# Patient Record
Sex: Male | Born: 1990 | Race: White | Hispanic: No | Marital: Single | Smoking: Never smoker
Health system: Southern US, Community
[De-identification: ages and names within clinical notes are randomized; demographics above are authoritative.]

---

## 2017-03-17 ENCOUNTER — Encounter: Payer: Self-pay | Admitting: Emergency Medicine

## 2017-03-17 ENCOUNTER — Emergency Department (INDEPENDENT_AMBULATORY_CARE_PROVIDER_SITE_OTHER): Payer: BLUE CROSS/BLUE SHIELD

## 2017-03-17 ENCOUNTER — Emergency Department
Admission: EM | Admit: 2017-03-17 | Discharge: 2017-03-17 | Disposition: A | Discharge status: Home / Self Care | Payer: BLUE CROSS/BLUE SHIELD | Source: Home / Self Care | Attending: Family Medicine | Admitting: Family Medicine

## 2017-03-17 DIAGNOSIS — W268XXA Contact with other sharp object(s), not elsewhere classified, initial encounter: Secondary | ICD-10-CM | POA: Diagnosis not present

## 2017-03-17 DIAGNOSIS — S81831A Puncture wound without foreign body, right lower leg, initial encounter: Secondary | ICD-10-CM

## 2017-03-17 DIAGNOSIS — S91311A Laceration without foreign body, right foot, initial encounter: Secondary | ICD-10-CM

## 2017-03-17 DIAGNOSIS — S99921A Unspecified injury of right foot, initial encounter: Secondary | ICD-10-CM

## 2017-03-17 DIAGNOSIS — S80811A Abrasion, right lower leg, initial encounter: Secondary | ICD-10-CM

## 2017-03-17 MED ORDER — DOXYCYCLINE HYCLATE 100 MG PO CAPS: 100.0000 mg | ORAL_CAPSULE | Freq: Two times a day (BID) | ORAL | 0 refills | Status: AC

## 2017-03-17 MED ORDER — TRAMADOL HCL 50 MG PO TABS: 50.0000 mg | ORAL_TABLET | Freq: Four times a day (QID) | ORAL | 0 refills | Status: AC | PRN

## 2017-03-17 NOTE — Discharge Instructions (Signed)
°  You may take 500mg acetaminophen every 4-6 hours or in combination with ibuprofen 400-600mg every 6-8 hours as needed for pain and inflammation. ° °Tramadol is strong pain medication. While taking, do not drink alcohol, drive, or perform any other activities that requires focus while taking these medications.  ° ° °

## 2017-03-17 NOTE — ED Triage Notes (Signed)
Patient presents to [facility] with a puncture wound noted to Right shin area. Patient jumped down off a boat dock and struck his shin on a metal boat dock. Puncture area is approximately the size of a dime. Discoloration and an abrasion noted to the lower leg. Last Tetanus 2 months ago.

## 2017-03-17 NOTE — ED Provider Notes (Signed)
[facility]    CSN: [id number] Arrival date & time: 03/17/17  1543     History   Chief Complaint Chief Complaint  Patient presents with  . Puncture Wound    HPI Bill Burke is a 26 y.o. male.   HPI Bill Burke is a 26 y.o. male presenting to UC with c/o puncture wound and abrasion to Right lower leg that occurred earlier this afternoon. Pt was jumping from his boat onto a dock and struck his leg against a metal part on the dock.  Last tetanus was 2 months ago.  Pain is 3/10, burning, worse with ambulation.  Bleeding somewhat controlled with pressure bandage but oozes when walking.  No other injuries.    History reviewed. No pertinent past medical history.  There are no active problems to display for this patient.   History reviewed. No pertinent surgical history.     Home Medications    Prior to Admission medications   Medication Sig Start Date End Date Taking? Authorizing Provider  doxycycline (VIBRAMYCIN) 100 MG capsule Take 1 capsule (100 mg total) by mouth 2 (two) times daily. One po bid x 10 days 03/17/17   Lurene Shadow, PA-C  traMADol (ULTRAM) 50 MG tablet Take 1 tablet (50 mg total) by mouth every 6 (six) hours as needed. 03/17/17   Lurene Shadow, PA-C    Family History History reviewed. No pertinent family history.  Social History Social History  Substance Use Topics  . Smoking status: Never Smoker  . Smokeless tobacco: Never Used  . Alcohol use 6.0 oz/week    10 Cans of beer per week     Comment: Per day     Allergies   Patient has no allergy information on record.   Review of Systems Review of Systems  Musculoskeletal: Positive for myalgias. Negative for arthralgias and gait problem.  Skin: Positive for wound. Negative for color change.     Physical Exam Triage Vital Signs ED Triage Vitals  Enc Vitals Group     BP 03/17/17 1629 131/81     Pulse Rate 03/17/17 1629 89     Resp 03/17/17 1629 16     Temp 03/17/17 1629 98.3  F (36.8 C)     Temp Source 03/17/17 1629 Oral     SpO2 03/17/17 1629 98 %     Weight 03/17/17 1630 250 lb (113.4 kg)     Height 03/17/17 1630  (1.854 m)     Head Circumference --      Peak Flow --      Pain Score 03/17/17 1630 3     Pain Loc --      Pain Edu? --      Excl. in GC? --    No data found.   Updated Vital Signs BP 131/81 (BP Location: Right Arm)   Pulse 89   Temp 98.3 F (36.8 C) (Oral)   Resp 16   Ht  (1.854 m)   Wt 250 lb (113.4 kg)   SpO2 98%   BMI 32.98 kg/m   Visual Acuity Right Eye Distance:   Left Eye Distance:   Bilateral Distance:    Right Eye Near:   Left Eye Near:    Bilateral Near:     Physical Exam  Constitutional: He is oriented to person, place, and time. He appears well-developed and well-nourished.  HENT:  Head: Normocephalic and atraumatic.  Eyes: EOM are normal.  Neck: Normal range of motion.  Cardiovascular: Normal rate.   Pulses:      Dorsalis pedis pulses are 2+ on the right side.       Posterior tibial pulses are 2+ on the right side.  Pulmonary/Chest: Effort normal.  Musculoskeletal: Normal range of motion. He exhibits tenderness. He exhibits no edema.  Right leg: full ROM knee and ankle, calf is soft, non-tender (see skin exam) Right foot: mild edema and tenderness to dorsal of foot.   Neurological: He is alert and oriented to person, place, and time.  Skin: Skin is warm and dry.     Right anterior lower leg: dime-sized puncture wound oozing red blood. Muscle tissue exposed. No bone visualized. No foreign bodies seen or palpated.   Superficial abrasion from puncture wound to anterior ankle.  Psychiatric: He has a normal mood and affect. His behavior is normal.  Nursing note and vitals reviewed.    UC Treatments / Results  Labs (all labs ordered are listed, but only abnormal results are displayed) Labs Reviewed - No data to display  EKG  EKG Interpretation None       Radiology Dg Tibia/fibula  Right  Result Date: 03/17/2017 CLINICAL DATA:  Right lower leg puncture wound. EXAM: RIGHT TIBIA AND FIBULA - 2 VIEW COMPARISON:  None. FINDINGS: There is no evidence of fracture or other focal bone lesions. No radiopaque foreign body. Soft tissues are unremarkable. IMPRESSION: Negative.  No radiopaque foreign body. Electronically Signed   By: Obie Dredge M.D.   On: 03/17/2017 17:11   Dg Foot Complete Right  Result Date: 03/17/2017 CLINICAL DATA:  Laceration. EXAM: RIGHT FOOT COMPLETE - 3+ VIEW COMPARISON:  None. FINDINGS: There is no evidence of fracture or dislocation. There is no evidence of arthropathy or other focal bone abnormality. Soft tissues are unremarkable. IMPRESSION: Negative. Electronically Signed   By: Obie Dredge M.D.   On: 03/17/2017 17:12    Procedures .Marland KitchenLaceration Repair Date/Time: 03/18/2017 9:08 AM Performed by: Lurene Shadow Authorized by: Donna Christen A   Consent:    Consent obtained:  Verbal   Consent given by:  Patient   Risks discussed:  Infection, pain, poor cosmetic result, poor wound healing, nerve damage and need for additional repair   Alternatives discussed:  No treatment and delayed treatment Anesthesia (see MAR for exact dosages):    Anesthesia method:  Local infiltration   Local anesthetic:  Lidocaine 2% WITH epi Laceration details:    Location:  Leg   Leg location:  R lower leg   Length (cm):  1.8   Depth (mm):  5 Pre-procedure details:    Preparation:  Patient was prepped and draped in usual sterile fashion and imaging obtained to evaluate for foreign bodies Exploration:    Hemostasis achieved with:  Direct pressure   Wound exploration: wound explored through full range of motion and entire depth of wound probed and visualized     Wound extent: areolar tissue violated, fascia violated and nerve damage ( around edges of puncture wound)     Wound extent: no foreign bodies/material noted, no muscle damage noted, no tendon damage noted, no  underlying fracture noted and no vascular damage noted     Contaminated: no   Treatment:    Area cleansed with:  Betadine and saline   Amount of cleaning:  Extensive   Irrigation solution:  Sterile saline   Irrigation volume:  80cc   Irrigation method:  Syringe Mucous membrane repair:    Suture size:  4-0  Suture material:  Vicryl   Suture technique:  Vertical mattress   Number of sutures:  6 Skin repair:    Repair method: wound edges too far apart to approximate wound edges. Approximation:    Approximation:  Loose Post-procedure details:    Dressing:  Antibiotic ointment and bulky dressing   Patient tolerance of procedure:  Tolerated well, no immediate complications   (including critical care time)  Medications Ordered in UC Medications - No data to display   Initial Impression / Assessment and Plan / UC Course  I have reviewed the triage vital signs and the nursing notes.  Pertinent labs & imaging results that were available during my care of the patient were reviewed by me and considered in my medical decision making (see chart for details).     Deep puncture wound to anterior Right lower leg.  Six 4-0 vicryl applied under skin layer to help re-approximate some of wound. Unable to approximate skin edges due to width of wound.   Wound extensively flushed due to wound coming in contact with [place] water.  Bacitracin and pressure bandage applied. Wound will need to heal via secondary intension. Pt plans to drive down to [place] for a day trip. Encouraged to f/u in 2-3 days for wound recheck.  Encouraged to change bandage 2-3 times daily.  Will start on prophylactic Doxycycline.    Final Clinical Impressions(s) / UC Diagnoses   Final diagnoses:  Right foot injury, initial encounter  Puncture wound of right lower leg, initial encounter  Abrasion, right lower leg, initial encounter    New Prescriptions Discharge Medication List as of 03/17/2017  5:50 PM    START taking  these medications   Details  doxycycline (VIBRAMYCIN) 100 MG capsule Take 1 capsule (100 mg total) by mouth 2 (two) times daily. One po bid x 10 days, Starting Sun 03/17/2017, Print    traMADol (ULTRAM) 50 MG tablet Take 1 tablet (50 mg total) by mouth every 6 (six) hours as needed., Starting Sun 03/17/2017, Print         Controlled Substance Prescriptions [place] Controlled Substance Registry consulted? Yes, I have consulted the [place] Controlled Substances Registry for this patient, and feel the risk/benefit ratio today is favorable for proceeding with this prescription for a controlled substance.   Lurene Shadow, [place] 03/18/17 [phone]

## 2017-03-21 ENCOUNTER — Telehealth: Payer: Self-pay

## 2017-03-21 NOTE — Telephone Encounter (Signed)
Left VM to follow up with UC as needed, and to call if any questions or concerns.  Contact information left.

## 2018-02-26 IMAGING — DX DG FOOT COMPLETE 3+V*R*
3 series · 3 of 3 positions shown · non-contrast
Comparison: None.

CLINICAL DATA: Laceration.

EXAM:
RIGHT FOOT COMPLETE - 3+ VIEW

[foot ap]
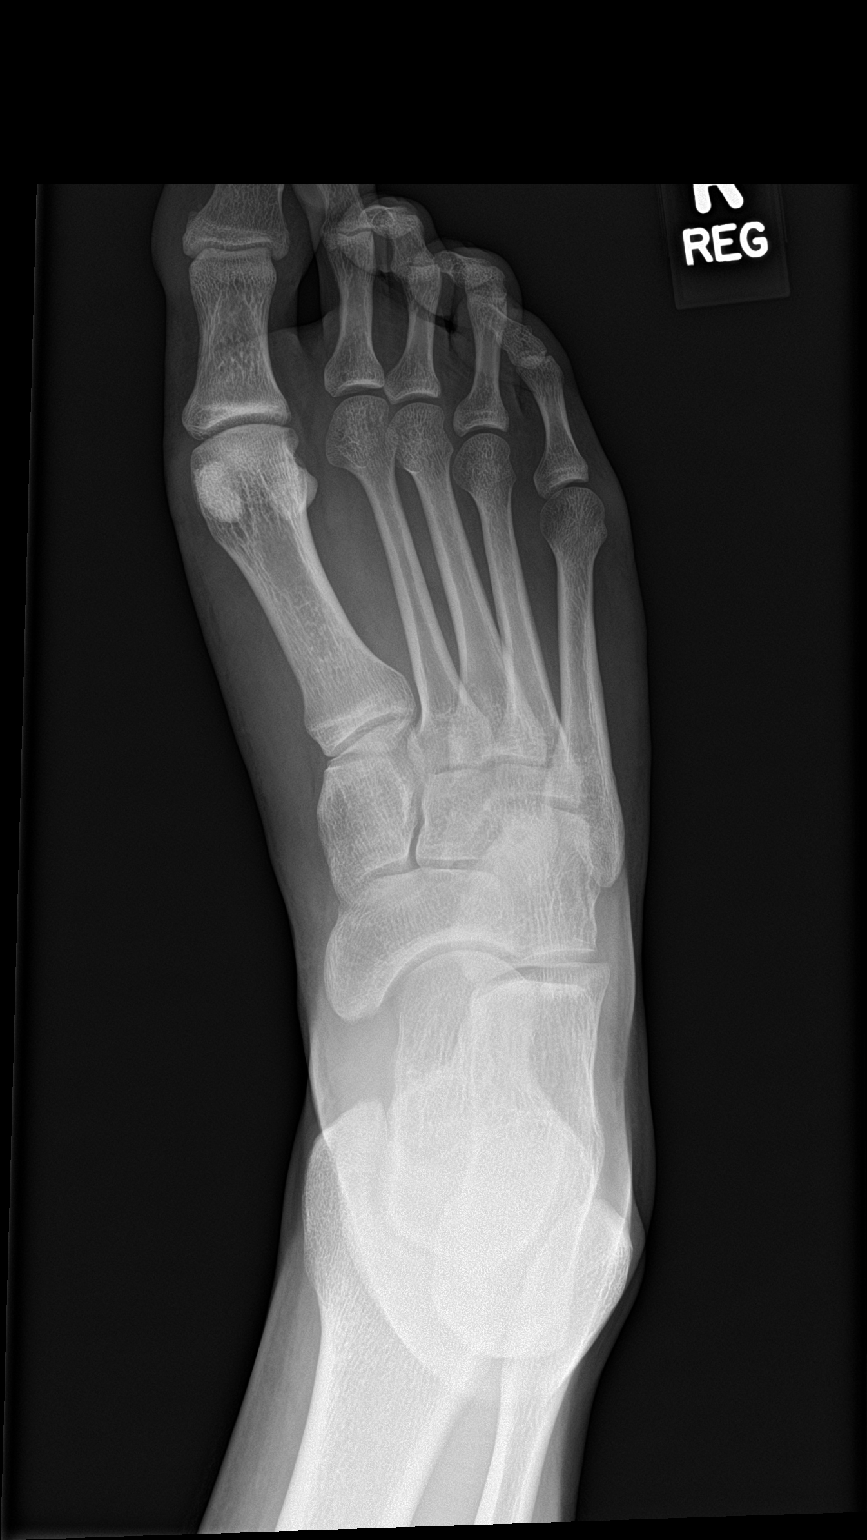

[foot obl]
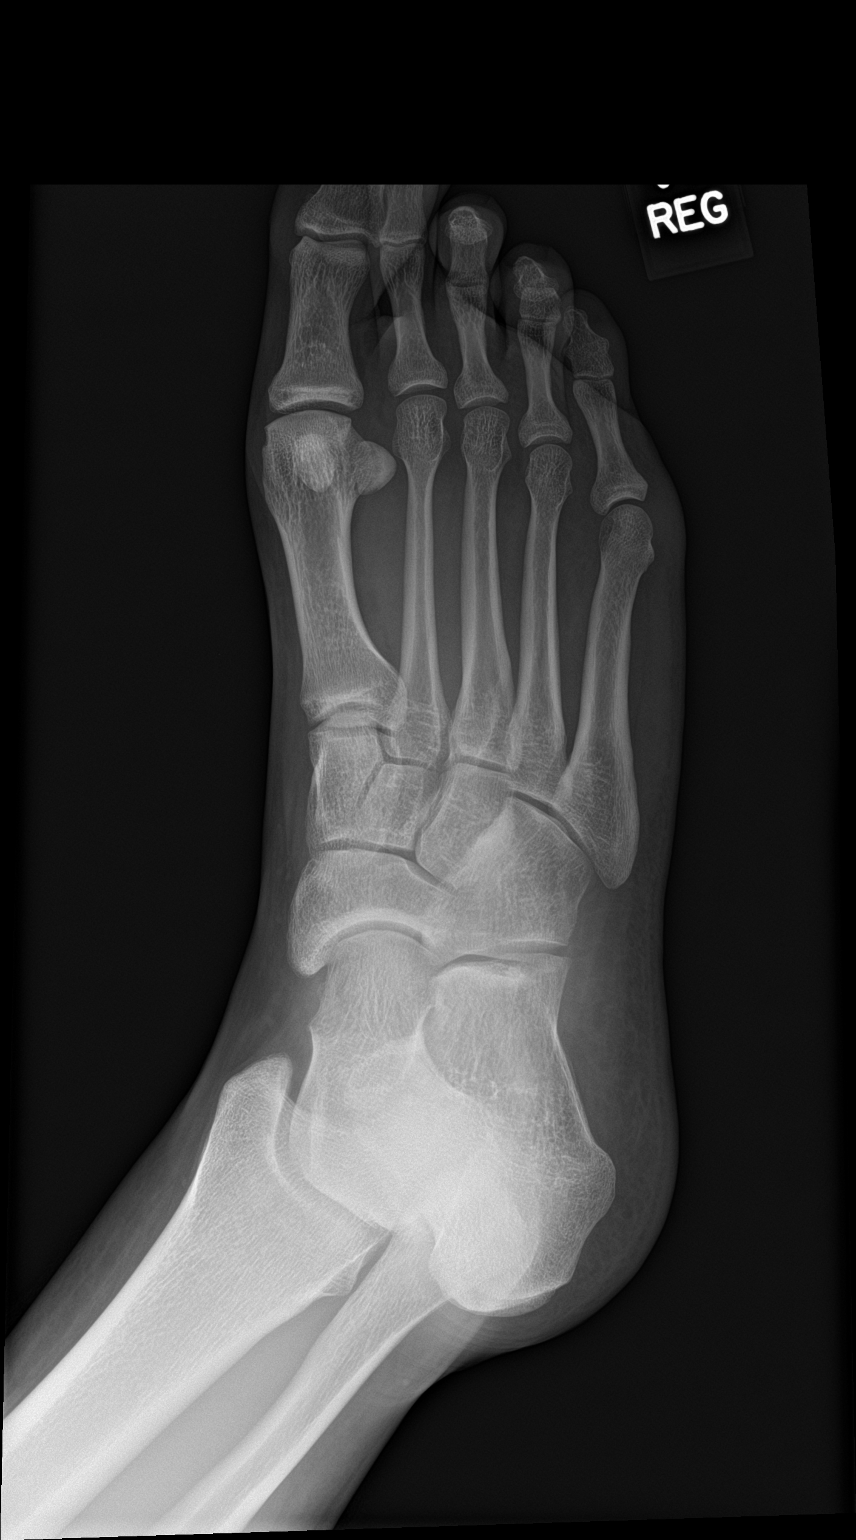

[foot lat]
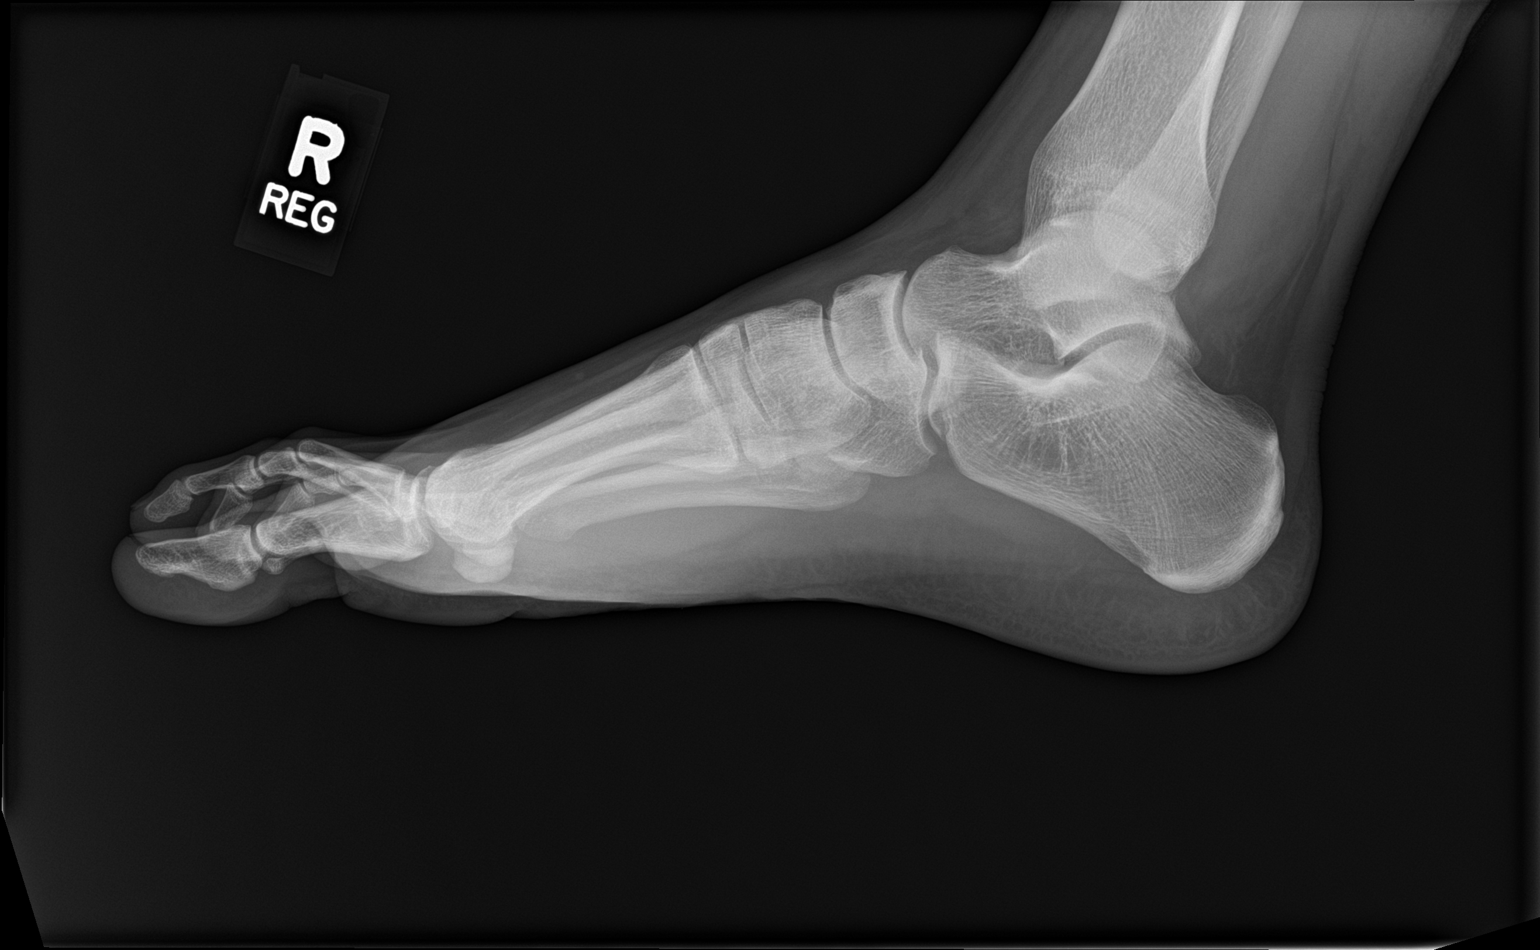

[3 of 3 positions shown; findings below may reference images not displayed]

FINDINGS: There is no evidence of fracture or dislocation. There is no
evidence of arthropathy or other focal bone abnormality. Soft
tissues are unremarkable.
IMPRESSION: Negative.

## 2018-02-26 IMAGING — DX DG TIBIA/FIBULA 2V*R*
4 series · 4 of 4 positions shown · non-contrast
Comparison: None.

CLINICAL DATA: Right lower leg puncture wound.

EXAM:
RIGHT TIBIA AND FIBULA - 2 VIEW

[tibia ap (1 of 2)]
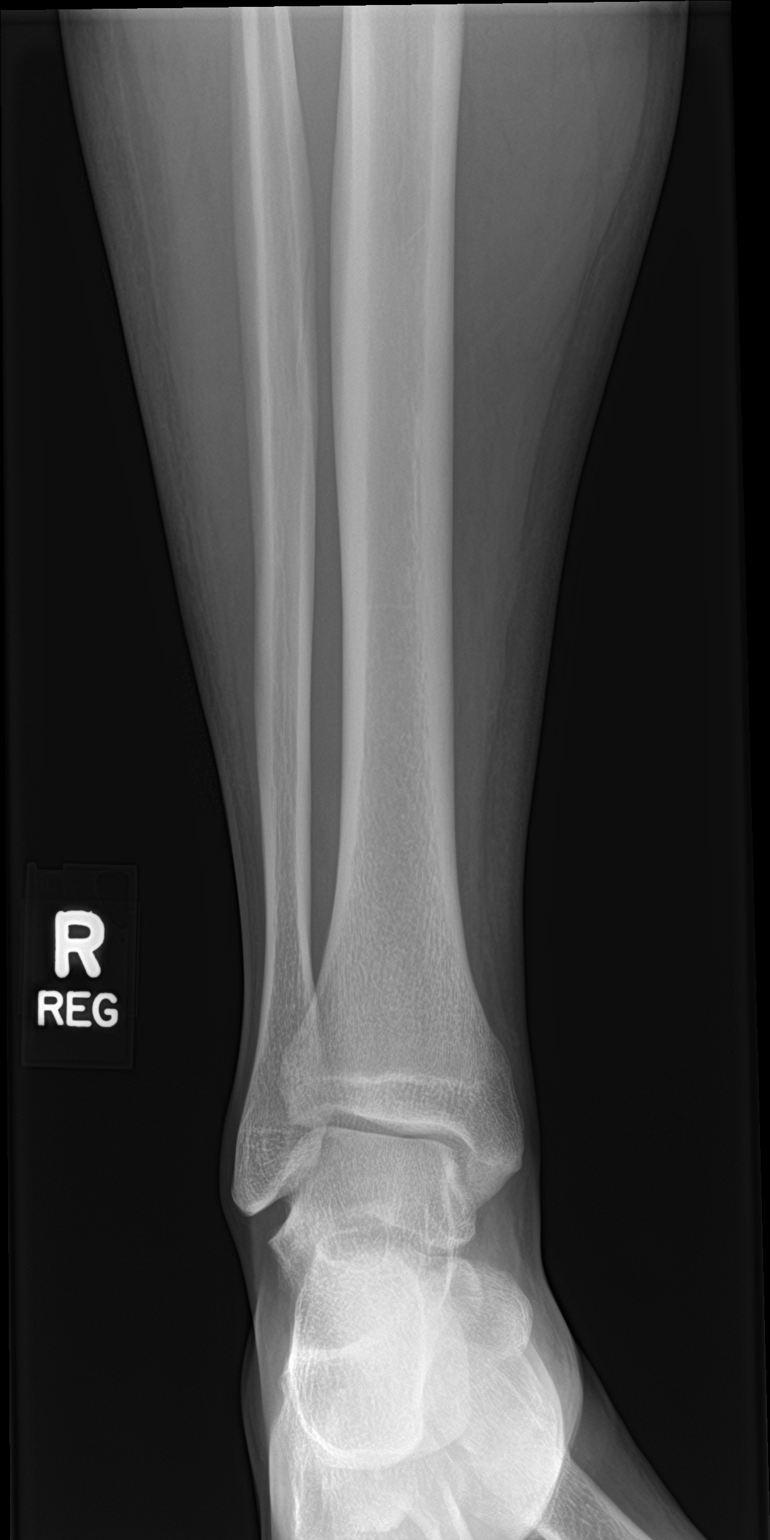

[tibia ap (2 of 2)]
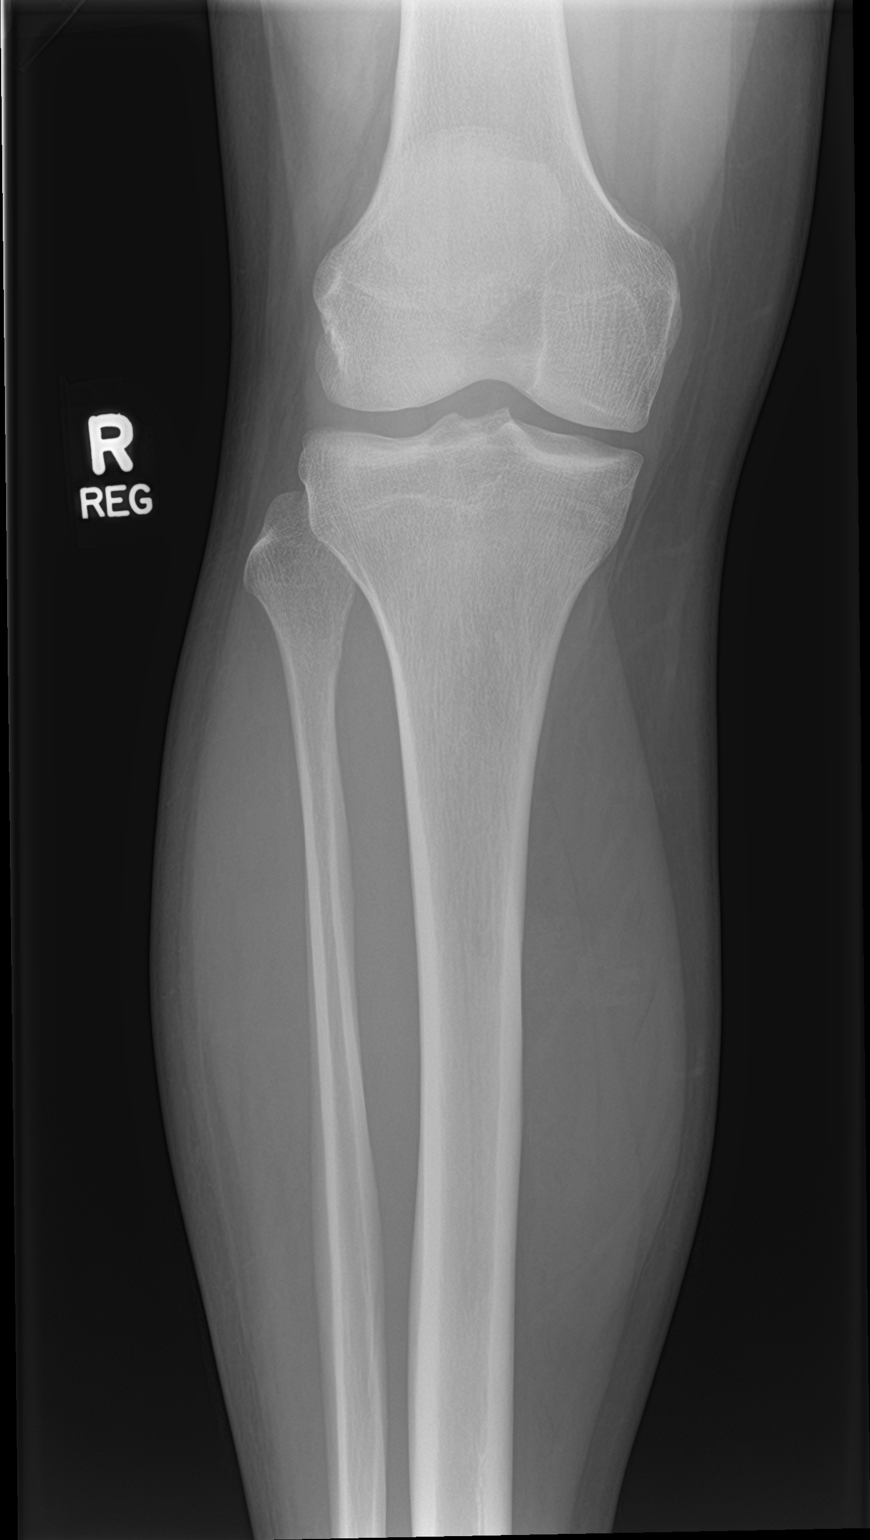

[tibia lat (1 of 2)]
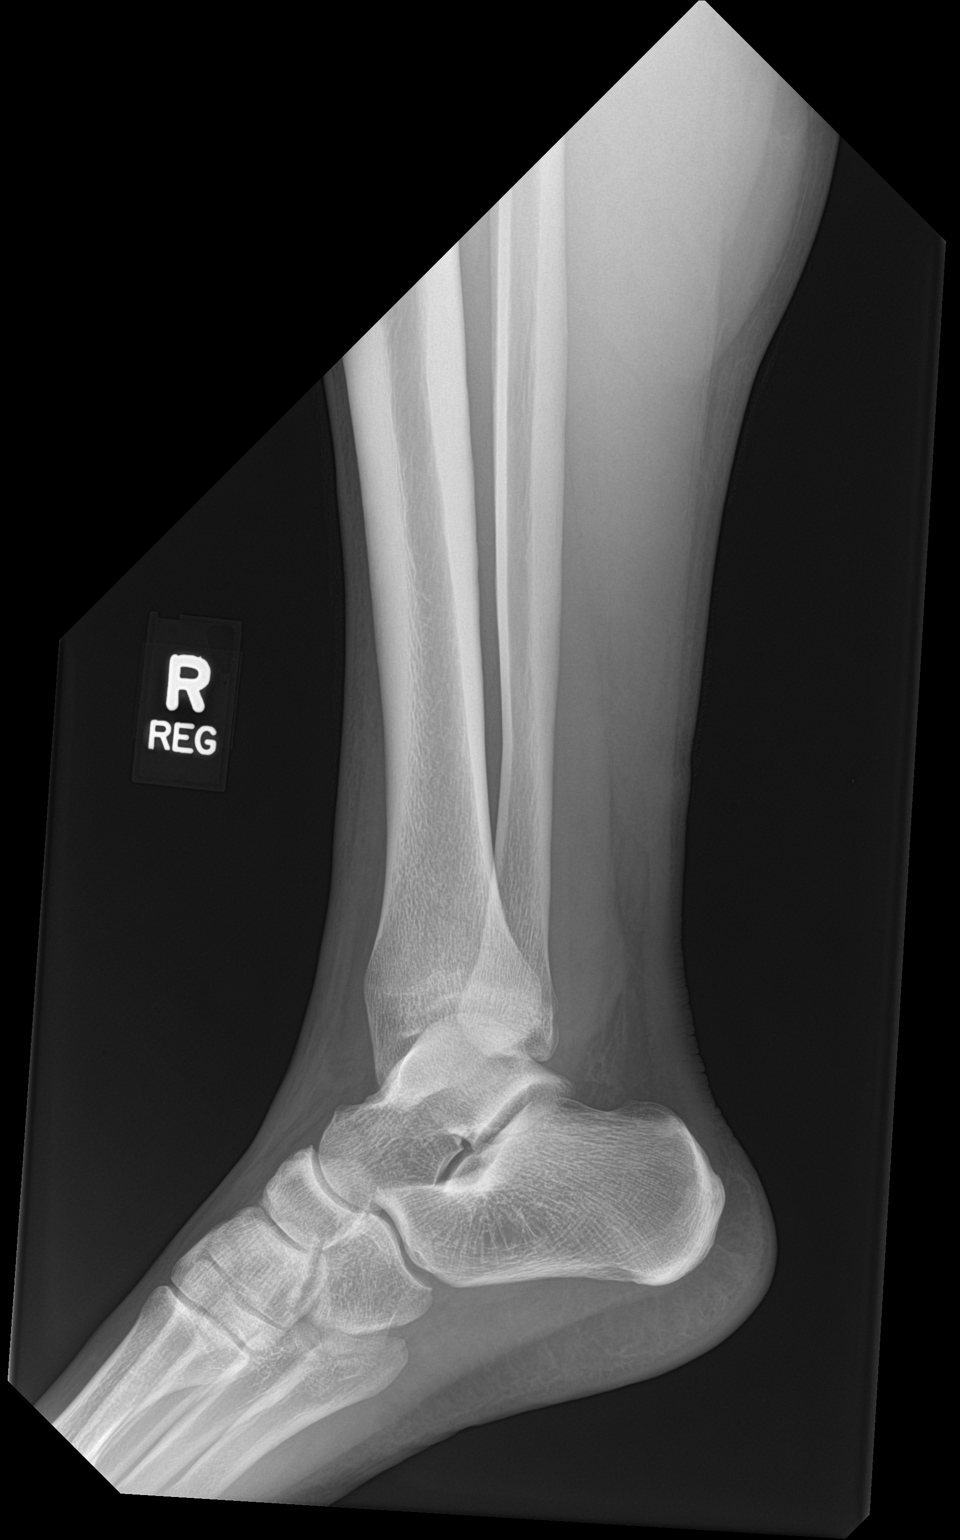

[tibia lat (2 of 2)]
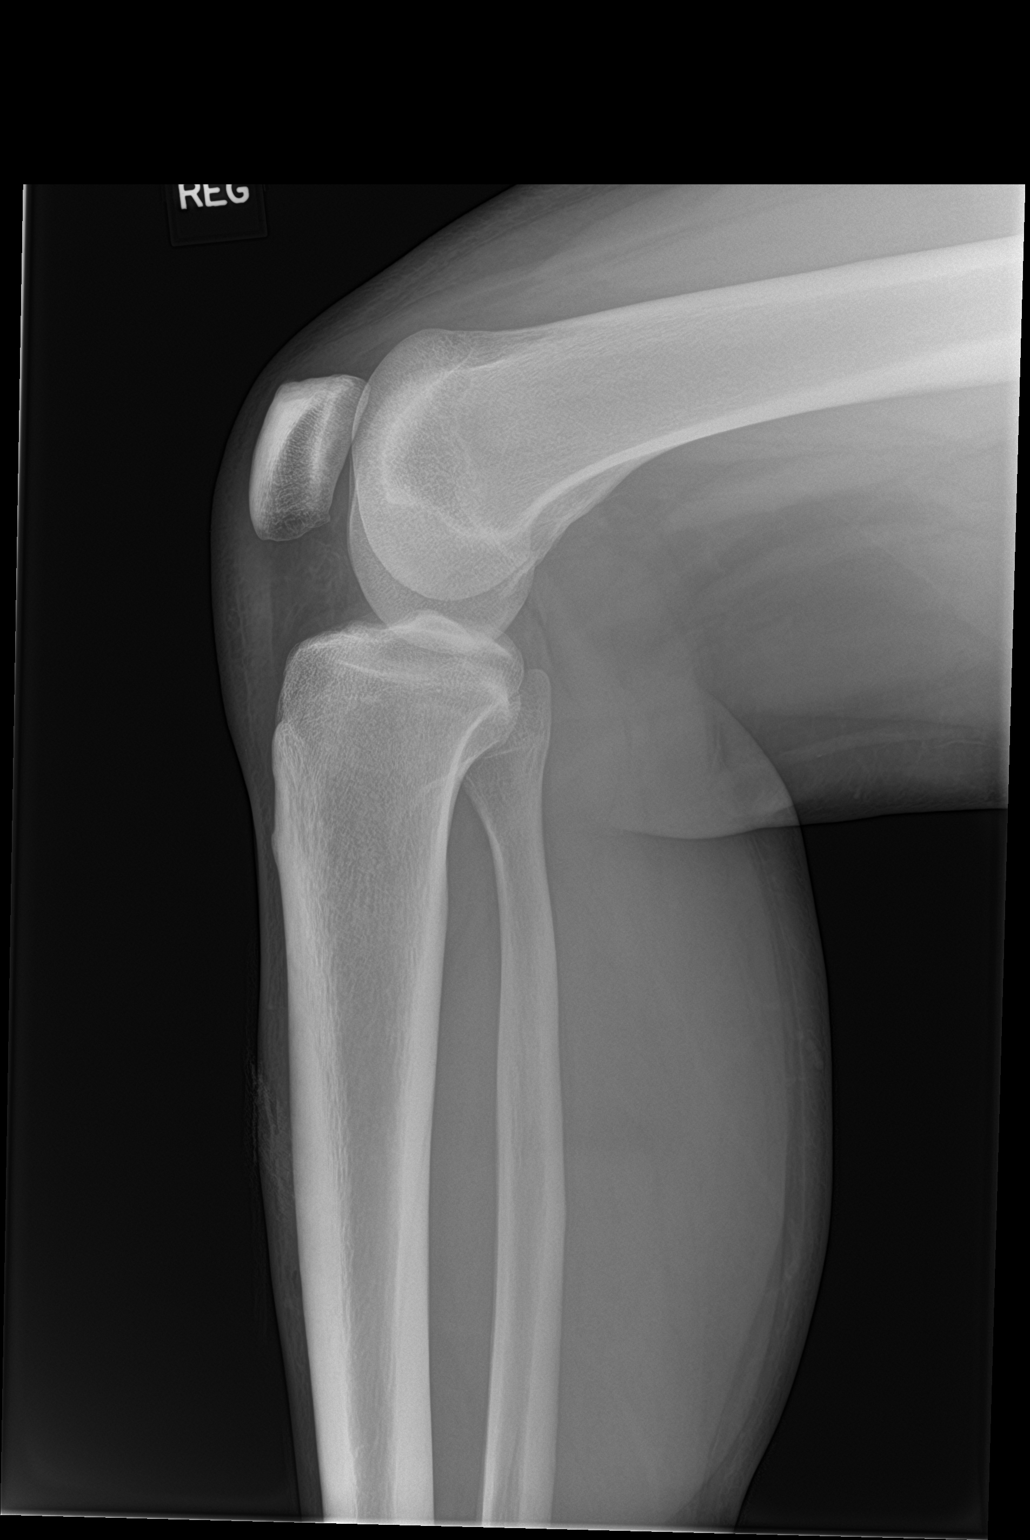

[4 of 4 positions shown; findings below may reference images not displayed]

FINDINGS: There is no evidence of fracture or other focal bone lesions. No
radiopaque foreign body. Soft tissues are unremarkable.
IMPRESSION: Negative.  No radiopaque foreign body.
